# Patient Record
Sex: Male | Born: 1990 | Race: White | Hispanic: No | Marital: Single | Smoking: Former smoker
Health system: Southern US, Community
[De-identification: ages and names within clinical notes are randomized; demographics above are authoritative.]

---
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
## 2013-04-01 ENCOUNTER — Ambulatory Visit: Payer: BC Managed Care – PPO | Admitting: Internal Medicine

## 2013-04-01 VITALS — BP 118/74 | HR 86 | Temp 98.2°F | Resp 16 | Ht 70.0 in | Wt 162.0 lb

## 2013-04-01 DIAGNOSIS — J029 Acute pharyngitis, unspecified: Secondary | ICD-10-CM

## 2013-04-01 DIAGNOSIS — R059 Cough, unspecified: Secondary | ICD-10-CM

## 2013-04-01 DIAGNOSIS — R05 Cough: Secondary | ICD-10-CM

## 2013-04-01 DIAGNOSIS — R509 Fever, unspecified: Secondary | ICD-10-CM

## 2013-04-01 LAB — POCT RAPID STREP A (OFFICE): Rapid Strep A Screen: NEGATIVE

## 2013-04-01 MED ORDER — AZITHROMYCIN 500 MG PO TABS: 500.0000 mg | ORAL_TABLET | Freq: Every day | ORAL | Status: DC

## 2013-04-01 NOTE — Progress Notes (Signed)
  Subjective:    Patient ID: Dustin Fisher, male    DOB: [birth year], 22 y.o.   MRN: [id number]  HPI Pt here c/o of sore throat, cough (green, yellow), swelling of the eyes. Started 1 day ago. Denies fever, nausea or sinus pressure. Pt is taking tylenol multi symptoms with no relieve.    Review of Systems     Objective:   Physical Exam        Assessment & Plan:

## 2013-04-01 NOTE — Patient Instructions (Addendum)
Viral and Bacterial Pharyngitis Pharyngitis is soreness (inflammation) or infection of the pharynx. It is also called a sore throat. CAUSES  Most sore throats are caused by viruses and are part of a cold. However, some sore throats are caused by strep and other bacteria. Sore throats can also be caused by post nasal drip from draining sinuses, allergies and sometimes from sleeping with an open mouth. Infectious sore throats can be spread from person to person by coughing, sneezing and sharing cups or eating utensils. TREATMENT  Sore throats that are viral usually last 3-4 days. Viral illness will get better without medications (antibiotics). Strep throat and other bacterial infections will usually begin to get better about 24-48 hours after you begin to take antibiotics. HOME CARE INSTRUCTIONS   If the caregiver feels there is a bacterial infection or if there is a positive strep test, they will prescribe an antibiotic. The full course of antibiotics must be taken. If the full course of antibiotic is not taken, you or your child may become ill again. If you or your child has strep throat and do not finish all of the medication, serious heart or kidney diseases may develop.  Drink enough water and fluids to keep your urine clear or pale yellow.  Only take over-the-counter or prescription medicines for pain, discomfort or fever as directed by your caregiver.  Get lots of rest.  Gargle with salt water ( tsp. of salt in a glass of water) as often as every 1-2 hours as you need for comfort.  Hard candies may soothe the throat if individual is not at risk for choking. Throat sprays or lozenges may also be used. SEEK MEDICAL CARE IF:   Large, tender lumps in the neck develop.  A rash develops.  Green, yellow-brown or bloody sputum is coughed up.  Your baby is older than 3 months with a rectal temperature of 100.5 F (38.1 C) or higher for more than 1 day. SEEK IMMEDIATE MEDICAL CARE IF:   A  stiff neck develops.  You or your child are drooling or unable to swallow liquids.  You or your child are vomiting, unable to keep medications or liquids down.  You or your child has severe pain, unrelieved with recommended medications.  You or your child are having difficulty breathing (not due to stuffy nose).  You or your child are unable to fully open your mouth.  You or your child develop redness, swelling, or severe pain anywhere on the neck.  You have a fever.  Your baby is older than 3 months with a rectal temperature of 102 F (38.9 C) or higher.  Your baby is 78 months old or younger with a rectal temperature of 100.4 F (38 C) or higher. MAKE SURE YOU:   Understand these instructions.  Will watch your condition.  Will get help right away if you are not doing well or get worse. Document Released: 10/11/2005 Document Revised: 01/03/2012 Document Reviewed: 01/08/2008 [facility] Patient Information 2014 [place], [place]. Smoking Cessation Quitting smoking is important to your health and has many advantages. However, it is not always easy to quit since nicotine is a very addictive drug. Often times, people try 3 times or more before being able to quit. This document explains the best ways for you to prepare to quit smoking. Quitting takes hard work and a lot of effort, but you can do it. ADVANTAGES OF QUITTING SMOKING  You will live longer, feel better, and live better.  Your body will  feel the impact of quitting smoking almost immediately.  Within 20 minutes, blood pressure decreases. Your pulse returns to its normal level.  After 8 hours, carbon monoxide levels in the blood return to normal. Your oxygen level increases.  After 24 hours, the chance of having a heart attack starts to decrease. Your breath, hair, and body stop smelling like smoke.  After 48 hours, damaged nerve endings begin to recover. Your sense of taste and smell improve.  After 72 hours, the body is  virtually free of nicotine. Your bronchial tubes relax and breathing becomes easier.  After 2 to 12 weeks, lungs can hold more air. Exercise becomes easier and circulation improves.  The risk of having a heart attack, stroke, cancer, or lung disease is greatly reduced.  After 1 year, the risk of coronary heart disease is cut in half.  After 5 years, the risk of stroke falls to the same as a nonsmoker.  After 10 years, the risk of lung cancer is cut in half and the risk of other cancers decreases significantly.  After 15 years, the risk of coronary heart disease drops, usually to the level of a nonsmoker.  If you are pregnant, quitting smoking will improve your chances of having a healthy baby.  The people you live with, especially any children, will be healthier.  You will have extra money to spend on things other than cigarettes. QUESTIONS TO THINK ABOUT BEFORE ATTEMPTING TO QUIT You may want to talk about your answers with your caregiver.  Why do you want to quit?  If you tried to quit in the past, what helped and what did not?  What will be the most difficult situations for you after you quit? How will you plan to handle them?  Who can help you through the tough times? Your family? Friends? A caregiver?  What pleasures do you get from smoking? What ways can you still get pleasure if you quit? Here are some questions to ask your caregiver:  How can you help me to be successful at quitting?  What medicine do you think would be best for me and how should I take it?  What should I do if I need more help?  What is smoking withdrawal like? How can I get information on withdrawal? GET READY  Set a quit date.  Change your environment by getting rid of all cigarettes, ashtrays, matches, and lighters in your home, car, or work. Do not let people smoke in your home.  Review your past attempts to quit. Think about what worked and what did not. GET SUPPORT AND ENCOURAGEMENT You  have a better chance of being successful if you have help. You can get support in many ways.  Tell your family, friends, and co-workers that you are going to quit and need their support. Ask them not to smoke around you.  Get individual, group, or telephone counseling and support. Programs are available at [employer] and health centers. Call your local health department for information about programs in your area.  Spiritual beliefs and practices may help some smokers quit.  Download a "quit meter" on your computer to keep track of quit statistics, such as how long you have gone without smoking, cigarettes not smoked, and money saved.  Get a self-help book about quitting smoking and staying off of tobacco. LEARN NEW SKILLS AND BEHAVIORS  Distract yourself from urges to smoke. Talk to someone, go for a walk, or occupy your time with a task.  Change your normal routine. Take a different route to work. Drink tea instead of coffee. Eat breakfast in a different place.  Reduce your stress. Take a hot bath, exercise, or read a book.  Plan something enjoyable to do every day. Reward yourself for not smoking.  Explore interactive web-based programs that specialize in helping you quit. GET MEDICINE AND USE IT CORRECTLY Medicines can help you stop smoking and decrease the urge to smoke. Combining medicine with the above behavioral methods and support can greatly increase your chances of successfully quitting smoking.  Nicotine replacement therapy helps deliver nicotine to your body without the negative effects and risks of smoking. Nicotine replacement therapy includes nicotine gum, lozenges, inhalers, nasal sprays, and skin patches. Some may be available over-the-counter and others require a prescription.  Antidepressant medicine helps people abstain from smoking, but how this works is unknown. This medicine is available by prescription.  Nicotinic receptor partial agonist medicine simulates  the effect of nicotine in your brain. This medicine is available by prescription. Ask your caregiver for advice about which medicines to use and how to use them based on your health history. Your caregiver will tell you what side effects to look out for if you choose to be on a medicine or therapy. Carefully read the information on the package. Do not use any other product containing nicotine while using a nicotine replacement product.  RELAPSE OR DIFFICULT SITUATIONS Most relapses occur within the first 3 months after quitting. Do not be discouraged if you start smoking again. Remember, most people try several times before finally quitting. You may have symptoms of withdrawal because your body is used to nicotine. You may crave cigarettes, be irritable, feel very hungry, cough often, get headaches, or have difficulty concentrating. The withdrawal symptoms are only temporary. They are strongest when you first quit, but they will go away within 10 14 days. To reduce the chances of relapse, try to:  Avoid drinking alcohol. Drinking lowers your chances of successfully quitting.  Reduce the amount of caffeine you consume. Once you quit smoking, the amount of caffeine in your body increases and can give you symptoms, such as a rapid heartbeat, sweating, and anxiety.  Avoid smokers because they can make you want to smoke.  Do not let weight gain distract you. Many smokers will gain weight when they quit, usually less than 10 pounds. Eat a healthy diet and stay active. You can always lose the weight gained after you quit.  Find ways to improve your mood other than smoking. FOR MORE INFORMATION  www.smokefree.gov  Document Released: 10/05/2001 Document Revised: 04/11/2012 Document Reviewed: 01/20/2012 [facility] Patient Information 2014 [place], [place].

## 2013-04-01 NOTE — Progress Notes (Signed)
  Subjective:    Patient ID: Dustin Fisher, male    DOB: 1990-11-05, 22 y.o.   MRN: [id number]  HPI Young smoker with very sore throat, has minor cough with yellow sputum.   Review of Systems smoker    Objective:   Physical Exam Very red throat Lungs few rhonchi  RST  Results for orders placed in visit on 04/01/13  POCT RAPID STREP A (OFFICE)      Result Value Range   Rapid Strep A Screen Negative  Negative       Assessment & Plan:  Zithromax 500mg  Quit smoking/Nicorrete

## 2013-08-06 ENCOUNTER — Ambulatory Visit (INDEPENDENT_AMBULATORY_CARE_PROVIDER_SITE_OTHER): Payer: BC Managed Care – PPO | Admitting: Family Medicine

## 2013-08-06 VITALS — BP 115/60 | HR 90 | Temp 98.7°F | Resp 18 | Ht 69.0 in | Wt 156.0 lb

## 2013-08-06 DIAGNOSIS — J029 Acute pharyngitis, unspecified: Secondary | ICD-10-CM

## 2013-08-06 DIAGNOSIS — R509 Fever, unspecified: Secondary | ICD-10-CM

## 2013-08-06 MED ORDER — PENICILLIN V POTASSIUM 500 MG PO TABS: 500.0000 mg | ORAL_TABLET | Freq: Two times a day (BID) | ORAL | Status: DC

## 2013-08-06 MED ORDER — IBUPROFEN 200 MG PO TABS: 600.0000 mg | ORAL_TABLET | Freq: Once | ORAL | Status: AC

## 2013-08-06 NOTE — Patient Instructions (Signed)
I will be in touch with you regarding your throat culture.  Use the penicillin as directed, and use ibuprofen or tylenol as needed for fever.  Rest and drink plenty of fluids.  Let me know if you are not better in the next couple of days- Sooner if worse.

## 2013-08-06 NOTE — Progress Notes (Addendum)
[facility] [address] [place] [address] [phone]  Date:  08/06/2013   Name:  Dustin Fisher   DOB:  1991/08/22   MRN:  [id number]  PCP:  No PCP Per Patient    Chief Complaint: Sore Throat   History of Present Illness:  Raequon Catanzaro is a 22 y.o. very pleasant male patient who presents with the following:  He awoke yesterday with a ST and pain into the right cervical glands/ right ear/  No cough, but it hurts to swallow and he does note some mucus drainage.    He has noted body aches and chills.  He not noted noted a fever until last night- he did not sleep well. He took some dayquil yesterday- he did take some delsym this morning No Gi symptoms.   No nasal sx.    He is generally healthy.  There have been sick contacts at this job.    There are no active problems to display for this patient.   History reviewed. No pertinent past medical history.  Past Surgical History  Procedure Laterality Date  . Percutaneous pinning elbow fracture  2008    History  Substance Use Topics  . Smoking status: Current Every Day Smoker -- 0.20 packs/day    Types: Cigarettes  . Smokeless tobacco: Not on file  . Alcohol Use: 1.2 oz/week    2 Cans of beer per week    Family History  Problem Relation Age of Onset  . Hypertension Mother   . Diabetes Mother   . Diabetes Maternal Grandmother   . Heart disease Paternal Grandfather     Allergies  Allergen Reactions  . Morphine And Related Hives    Medication list has been reviewed and updated.  Current Outpatient Prescriptions on File Prior to Visit  Medication Sig Dispense Refill  . azithromycin (ZITHROMAX) 500 MG tablet Take 1 tablet (500 mg total) by mouth daily.  5 tablet  0   No current facility-administered medications on file prior to visit.    Review of Systems:  As per HPI- otherwise negative.   Physical Examination: Filed Vitals:   08/06/13 0823  BP: 115/60  Pulse: 103  Temp: 101 F  (38.3 C)  Resp: 18   Filed Vitals:   08/06/13 0823  Height: 5\' 9"  (1.753 m)  Weight: 156 lb (70.761 kg)   Body mass index is 23.03 kg/(m^2). Ideal Body Weight: Weight in (lb) to have BMI = 25: 168.9  GEN: WDWN, NAD, Non-toxic, A & O x 3 HEENT: Atraumatic, Normocephalic. Neck supple. No masses, No LAD.  Bilateral TM wnl, oropharynx injected, blood on swab.  PEERL,EOMI.   Ears and Nose: No external deformity. CV: RRR, No M/G/R. No JVD. No thrill. No extra heart sounds. PULM: CTA B, no wheezes, crackles, rhonchi. No retractions. No resp. distress. No accessory muscle use. ABD: S, NT, ND. No rebound. No HSM. EXTR: No c/c/e NEURO Normal gait.  PSYCH: Normally interactive. Conversant. Not depressed or anxious appearing.  Calm demeanor.   Given 60 mg of ibuprofen; fever and tachycardia resolved  Results for orders placed in visit on 08/06/13  POCT RAPID STREP A (OFFICE)      Result Value Range   Rapid Strep A Screen Negative  Negative    Assessment and Plan: Pharyngitis - Plan: POCT rapid strep A, Culture, Group A Strep, penicillin v potassium (VEETID) 500 MG tablet  Fever, unspecified - Plan: ibuprofen (ADVIL,MOTRIN) tablet 600 mg, Culture, Group A  Strep  Pharyngitis and fever.  Suspect strep although rapid negative.  Start abx, supportive care.  Will plan further follow- up pending labs. See patient instructions for more details.     Signed Abbe Amsterdam, MD  10/15: called but no answer and VM not set up.  Letter to pt

## 2013-08-08 ENCOUNTER — Encounter: Payer: Self-pay | Admitting: Family Medicine

## 2013-08-08 LAB — CULTURE, GROUP A STREP: Organism ID, Bacteria: NORMAL

## 2015-07-21 ENCOUNTER — Ambulatory Visit (INDEPENDENT_AMBULATORY_CARE_PROVIDER_SITE_OTHER): Payer: BLUE CROSS/BLUE SHIELD

## 2015-07-21 ENCOUNTER — Ambulatory Visit (INDEPENDENT_AMBULATORY_CARE_PROVIDER_SITE_OTHER): Payer: BLUE CROSS/BLUE SHIELD | Admitting: Internal Medicine

## 2015-07-21 VITALS — BP 120/82 | HR 52 | Temp 97.9°F | Resp 16 | Ht 69.75 in | Wt 162.0 lb

## 2015-07-21 DIAGNOSIS — M546 Pain in thoracic spine: Secondary | ICD-10-CM

## 2015-07-21 DIAGNOSIS — S29019A Strain of muscle and tendon of unspecified wall of thorax, initial encounter: Secondary | ICD-10-CM

## 2015-07-21 DIAGNOSIS — S29009A Unspecified injury of muscle and tendon of unspecified wall of thorax, initial encounter: Secondary | ICD-10-CM | POA: Diagnosis not present

## 2015-07-21 MED ORDER — IBUPROFEN 600 MG PO TABS: 600.0000 mg | ORAL_TABLET | Freq: Three times a day (TID) | ORAL | Status: DC | PRN

## 2015-07-21 MED ORDER — METHOCARBAMOL 750 MG PO TABS: 750.0000 mg | ORAL_TABLET | Freq: Four times a day (QID) | ORAL | Status: DC

## 2015-07-21 NOTE — Patient Instructions (Signed)
Thoracic Strain Thoracic strain is an injury to the muscles of the upper back. A mild strain may take only 1 week to heal. Torn muscles or tendons may take 6 weeks to 2 months to heal. HOME CARE  Put ice on the injured area.  Put ice in a plastic bag.  Place a towel between your skin and the bag.  Leave the ice on for 15-20 minutes, 03-04 times a day, for the first 2 days.  Only take medicine as told by your doctor.  Go to physical therapy and perform exercises as told by your doctor.  Use wraps and back braces as told by your doctor.  Warm up before being active. GET HELP RIGHT AWAY IF:   There is more bruising, puffiness (swelling), or pain.  Medicine does not help the pain.  You have trouble breathing, chest pain, or a fever.  Your problems seem to be getting worse, not better. MAKE SURE YOU:   Understand these instructions.  Will watch your condition.  Will get help right away if you are not doing well or get worse. Document Released: 03/29/2008 Document Revised: 01/03/2012 Document Reviewed: 11/30/2010 [facility] Patient Information 2015 [place], [place]. This information is not intended to replace advice given to you by your health care provider. Make sure you discuss any questions you have with your health care provider. Subscapularis Disruption with Rehab A subscapularis disruption is a partial or complete tear of the subscapularis muscle or tendon. The subscapularis muscle is one of the rotator cuff muscles and is responsible for stabilizing the shoulder joint (glenohumeral) and inwardly (medially) rotating the upper arm. Subscapularis disruptions are uncommon, because the subscapularis is the strongest of the rotator cuff muscles. Collectively, the rotator cuff muscles are responsible for stabilizing the shoulder joint as well as providing the force for much of the shoulder movements. The shoulder joint allows more movement that any other joint, and for this reason it is  susceptible to injury. SYMPTOMS   Shoulder pain, especially in the front (anterior) part of the shoulder and upper arm.  Pain that worsens with function of the subscapularis.  Tenderness, inflammation, or redness over the injury.  Decreased shoulder function, especially medial rotation.  Crackling sound (crepitation) when the shoulder is moved.  Recurrent shoulder dislocation, if disruption is due to or associated with shoulder dislocation. CAUSES  Subscapularis disruptions occur when a force is placed on the subscapularis muscle or tendon that is greater than it can withstand. Common mechanisms of injury include:  Forceful outward (external) rotation of the shoulder.  Hyperextension of the shoulder.  Direct trauma to the shoulder.  Shoulder dislocation. RISK INCREASES WITH:  Contact sports (football, wrestling, or rugby).  Previous shoulder injury.  Poor strength and flexibility.  Failure to warm-up properly before activity.  Poorly fitted or padded protective equipment.  Shoulder dislocation or subluxation.  Shoulder surgery that required moving the subscapularis muscle or tendon. PREVENTION  Warm up and stretch properly before activity.  Maintain physical fitness:  Strength, flexibility, and endurance.  Cardiovascular fitness.  Learn and use proper technique. When possible, have a coach correct improper technique. PROGNOSIS  Subscapularis disruptions are unable to heal without surgical intervention. RELATED COMPLICATIONS   Recurrent symptoms that result in a chronic problem.  Other shoulder conditions (frozen shoulder).  Inability to compete in athletics at the same level of play.  Recurrent shoulder dislocation.  Risks of surgery: infection, bleeding, nerve damage, or damage to surrounding tissues. TREATMENT  Treatment initially involves the use  of ice and medication to help reduce pain and inflammation. The use of strengthening and stretching  exercises may help reduce pain with activity. These exercises may be performed at home or with referral to a therapist. Surgery is the definitive treatment for this injury. Surgery involves reattaching the torn tendon. In order to have the best outcome, then surgery should be performed within 3 months of injury. After surgery a post-operative rehabilitation program is necessary to regain strength and a full range of motion. Return to sports typically takes 6 to 12 months. MEDICATION   If pain medication is necessary, then nonsteroidal anti-inflammatory medications, such as aspirin and ibuprofen, or other minor pain relievers, such as acetaminophen, are often recommended.  Do not take pain medication for 7 days before surgery.  Prescription pain relievers may be given if deemed necessary by your caregiver. Use only as directed and only as much as you need. HEAT AND COLD  Cold treatment (icing) relieves pain and reduces inflammation. Cold treatment should be applied for 10 to 15 minutes every 2 to 3 hours for inflammation and pain and immediately after any activity that aggravates your symptoms. Use ice packs or massage the area with a piece of ice (ice massage).  Heat treatment may be used prior to performing the stretching and strengthening activities prescribed by your caregiver, physical therapist, or athletic trainer. Use a heat pack or soak your injury in warm water. SEEK MEDICAL CARE IF:  Treatment seems to offer no benefit, or the condition worsens.  Any medications produce adverse side effects.  Any complications from surgery occur:  Pain, numbness, or coldness in the extremity operated upon.  Discoloration of the nail beds (they become blue or gray) of the extremity operated upon.  Signs of infections (fever, pain, inflammation, redness, or persistent bleeding). EXERCISES RANGE OF MOTION (ROM) AND STRETCHING EXERCISES ROM - Subscapularis Disruption These exercises may help you  restore your elbow mobility once your physician has discontinued your immobilization period. Beginning these before your provider's approval may result in delayed healing. Your symptoms may resolve with or without further involvement from your physician, physical therapist or athletic trainer. While completing these exercises, remember:   Restoring tissue flexibility helps normal motion to return to the joints. This allows healthier, less painful movement and activity.  An effective stretch should be held for at least 30 seconds. A stretch should never be painful. You should only feel a gentle lengthening or release in the stretch. ROM - Pendulum   Bend at the waist so that your right / left arm falls away from your body. Support yourself with your opposite hand on a solid surface, such as a table or a countertop.  Your right / left arm should be perpendicular to the ground. If it is not perpendicular, you need to lean over farther. Relax the muscles in your right / left arm and shoulder as much as possible.  Gently sway your hips and trunk so they move your right / left arm without any use of your right / left shoulder muscles.  Progress your movements so that your right / left arm moves side to side, then forward and backward, and finally, both clockwise and counterclockwise.  Complete __________ repetitions in each direction. Many people use this exercise to relieve discomfort in their shoulder as well as to gain range of motion. Repeat __________ times. Complete this exercise __________ times per day. STRETCH - Flexion, Seated   Sit in a firm chair so that your  right / left forearm can rest on a table or on a table or countertop. Your right / left elbow should rest below the height of your shoulder so that your shoulder feels supported and not tense or uncomfortable.  Keeping your right / left shoulder relaxed, lean forward at your waist, allowing your right / left hand to slide forward.  Bend forward until you feel a moderate stretch in your shoulder, but before you feel an increase in your pain.  Hold __________ seconds. Slowly return to your starting position. Repeat __________ times. Complete this exercise __________ times per day.  STRETCH - Flexion, Standing  Stand with good posture. With an underhand grip on your right / left and an overhand grip on the opposite hand, grasp a broomstick or cane so that your hands are a little more than shoulder-width apart.  Keeping your right / leftelbow straight and shoulder muscles relaxed, push the stick with your opposite hand to raise your right / left arm in front of your body and then overhead. Raise your arm until you feel a stretch in your right / left shoulder, but before you have increased shoulder pain.  Try to avoid shrugging your right / left shoulder as your arm rises by keeping your shoulder blade tucked down and toward your mid-back spine. Hold __________ seconds.  Slowly return to the starting position. Repeat __________ times. Complete this exercise __________ times per day. STRETCH - Abduction, Supine  Stand with good posture. With an underhand grip on your right / left and an overhand grip on the opposite hand, grasp a broomstick or cane so that your hands are a little more than shoulder-width apart.  Keeping your right / leftelbow straight and shoulder muscles relaxed, push the stick with your opposite hand to raise your right / left arm out to the side of your body and then overhead. Raise your arm until you feel a stretch in your right / left shoulder, but before you have increased shoulder pain.  Try to avoid shrugging your right / left shoulder as your arm rises by keeping your shoulder blade tucked down and toward your mid-back spine. Hold __________ seconds.  Slowly return to the starting position. Repeat __________ times. Complete this exercise __________ times per day. ROM - Flexion, Active-Assisted  Lie  on your back. You may bend your knees for comfort.  Grasp a broomstick or cane so your hands are about shoulder-width apart. Your right / left hand should grip the end of the stick/cane so that your hand is positioned "thumbs-up," as if you were about to shake hands.  Using your healthy arm to lead, raise your right / left arm overhead until you feel a gentle stretch in your shoulder. Hold __________ seconds.  Use the stick/cane to assist in returning your right / left arm to its starting position. Repeat __________ times. Complete this exercise __________ times per day.  STRETCH - External Rotation and Abduction  Stagger your stance through a doorframe. It does not matter which foot is forward.  Choose one of the following positions as instructed by your physician, physical therapist or athletic trainer. Place your hands:  and forearms above your head and on the door frame.  and forearms at head-height and on the door frame.  at elbow-height and on the door frame.  Keeping your head and chest upright and your stomach muscles tight to prevent over-extending your low-back, slowly shift your weight onto your front foot until you feel a stretch  across your chest and/or in the front of your shoulders.  Hold __________ seconds. Shift your weight to your back foot to release the stretch. Repeat __________ times. Complete this stretch __________ times per day.  STRENGTHENING EXERCISES - Subscapularis Disruption These exercises may help you when beginning to rehabilitate your injury. They may resolve your symptoms with or without further involvement from your physician, physical therapist or athletic trainer. While completing these exercises, remember:   Muscles can gain both the endurance and the strength needed for everyday activities through controlled exercises.  Complete these exercises as instructed by your physician, physical therapist or athletic trainer. Progress with the resistance and  repetition exercises only as your caregiver advises.  You may experience muscle soreness or fatigue, but the pain or discomfort you are trying to eliminate should never worsen during these exercises. If this pain does worsen, stop and make certain you are following the directions exactly. If the pain is still present after adjustments, discontinue the exercise until you can discuss the trouble with your clinician. STRENGTH - Shoulder Extensors, Prone  Lie on your stomach on a firm surface so that your right / left arm overhangs the edge. Rest your forehead on your opposite forearm. With your thumb facing away from your body and your elbow straight, hold a __________ weight in your hand.  Squeeze your right / left shoulder blade to your mid-back spine and then slowly raise your arm behind you to the height of the bed.  Hold for __________ seconds. Slowly reverse the directions and return to the starting position, controlling the weight as you lower your arm. Repeat __________ times. Complete this exercise __________ times per day.  STRENGTH - Internal Rotators, Isometric  Keep your right / left elbow at your side and bend it 90 degrees.  Step into a door frame so that the inside of your right / left wrist can press against the door frame without your upper arm leaving your side.  Gently press your right / left wrist into the door frame as if you were trying to draw the palm of your hand to your abdomen. Gradually increase the tension until you are pressing as hard as you can without shrugging your shoulder or increasing any shoulder discomfort.  Hold __________ seconds.  Release the tension slowly. Relax your shoulder muscles completely before you do the next repetition. Repeat __________ times. Complete this exercise __________ times per day.  STRENGTH - Internal Rotators  Secure a rubber exercise band/tubing to a fixed object so that it is at the same height as your right / left elbow when  you are standing or sitting on a firm surface.  Stand or sit so that the secured exercise band/tubing is at your right / left side.  Bend your elbow 90 degrees. Place a folded towel or small pillow under your right / left arm so that your elbow is a few inches away from your side.  Keeping the tension on the exercise band/tubing, pull it across your body toward your abdomen. Be sure to keep your body steady so that the movement is only coming from your shoulder rotating.  Hold __________ seconds. Release the tension in a controlled manner as you return to the starting position. Repeat __________ times. Complete this exercise __________ times per day.  STRENGTH - External Rotators   Secure a rubber exercise band/tubing to a fixed object so that it is at the same height as your right / left elbow when you are  standing or sitting on a firm surface.  Stand or sit so that the secured exercise band/tubing is at your side that is not injured.  Bend your elbow 90 degrees. Place a folded towel or small pillow under your right / left arm so that your elbow is a few inches away from your side.  Keeping the tension on the exercise band/tubing, pull it away from your body, as if pivoting on your elbow. Be sure to keep your body steady so that the movement is only coming from your shoulder rotating.  Hold __________ seconds. Release the tension in a controlled manner as you return to the starting position. Repeat __________ times. Complete this exercise __________ times per day.  STRENGTH - Shoulder Extensors   Secure a rubber exercise band/tubing so that it is at the height of your shoulders when you are either standing or sitting on a firm arm-less chair.  With a thumbs-up grip, grasp an end of the band/tubing in each hand. Straighten your elbows and lift your hands straight in front of you at shoulder height. Step back away from the secured end of band/tubing until it becomes tense.  Squeezing your  shoulder blades together, pull your hands down to the sides of your thighs. Do not allow your hands to go behind you.  Hold for __________ seconds. Slowly ease the tension on the band/tubing as you reverse the directions and return to the starting position. Repeat __________ times. Complete this exercise __________ times per day.  STRENGTH - Scapular Protractors, Quadruped  Get onto your hands and knees with your shoulders directly over your hands (or as close as you comfortably can be).  Keeping your elbows locked, lift the back of your rib cage up into your shoulder blades so your mid-back rounds-out. Keep your neck muscles relaxed.  Hold this position for __________ seconds. Slowly return to the starting position and allow your muscles to relax completely before completing the next repetition. Repeat __________ times. Complete this exercise __________ times per day.  STRENGTH - Scapular Depressors  Find a sturdy chair without wheels.  Keeping your feet on the floor, lift your bottom from the seat and lock your elbows.  Keeping your elbows straight, allow gravity to pull your body weight down. Your shoulders will rise toward your ears.  Raise your body against gravity by drawing your shoulder blades down your back, shortening the distance between your shoulders and ears. Although your feet should always maintain contact with the floor, your feet should progressively support less body weight as you get stronger.  Hold __________ seconds. In a controlled and slow manner, lower your body weight to begin the next repetition. Repeat __________ times. Complete this exercise __________ times per day.  STRENGTH - Horizontal Adductors  Secure a rubber exercise band/tubing so that it is at the height of your shoulders when you are either standing or sitting on a firm arm-less chair.  Turn away from the secured band/tube so it is directly behind you. Grasp an end of the band/tubing in each hand  and have your palms face each other. Step forward until the end of band/tubing until it becomes tense.  Keeping your arms at your sides, lift your elbows so they are 90 degrees from your body. Your arms should be slightly bent.  Keeping your arms elevated 90 degrees, draw your palms together  Hold __________ seconds. Slowly ease the tension on the band/tubing as you reverse the directions and return to the starting position. Repeat  __________ times. Complete this exercise __________ times per day. Document Released: 10/11/2005 Document Revised: 01/03/2012 Document Reviewed: 01/23/2009 [facility] Patient Information 2015 [place], [place]. This information is not intended to replace advice given to you by your health care provider. Make sure you discuss any questions you have with your health care provider.

## 2015-07-21 NOTE — Progress Notes (Signed)
Patient ID: Dustin Fisher, male   DOB: [birth year], 24 y.o.   MRN: [id number]   07/21/2015 at 11:56 AM  Dustin Fisher / DOB: 1991-01-17 / MRN: [id number]  Problem list reviewed and updated by me where necessary.   SUBJECTIVE  Dustin Fisher is a 24 y.o. well appearing male presenting for the chief complaint of thoracic right chest pain, starts in back and can radiate right neck and anterior sternally. No weakness, numbness, function loss. It started two weeks ago, had im[proved but swung the driver and felt pain right sub scapular. Also felt a pop reaching at work same area. No c/o NMSV loss..     He  has no past medical history on file.    Medications reviewed and updated by myself where necessary, and exist elsewhere in the encounter.   Dustin Fisher is allergic to morphine and related. He  reports that he quit smoking about 2 months ago. His smoking use included Cigarettes. He smoked 0.20 packs per day. He does not have any smokeless tobacco history on file. He reports that he drinks about 1.2 oz of alcohol per week. He reports that he does not use illicit drugs. He  has no sexual activity history on file. The patient  has past surgical history that includes Percutaneous pinning elbow fracture (2008).  His family history includes Diabetes in his maternal grandmother and mother; Heart disease in his paternal grandfather; Hypertension in his mother.  Review of Systems  Constitutional: Negative for fever.  Respiratory: Negative for shortness of breath.   Cardiovascular: Negative for chest pain.  Gastrointestinal: Negative for nausea.  Musculoskeletal: Positive for back pain and neck pain.  Skin: Negative for rash.  Neurological: Negative for dizziness, tingling, tremors, sensory change, focal weakness and headaches.    OBJECTIVE  His  height is 5' 9.75" (1.772 m) and weight is 162 lb (73.483 kg). His oral temperature is 97.9 F (36.6 C). His blood pressure is 120/82 and his pulse is 52. His  respiration is 16 and oxygen saturation is 98%.  The patient's body mass index is 23.4 kg/(m^2).  Physical Exam  Vitals reviewed. Constitutional: He is oriented to person, place, and time. He appears well-developed and well-nourished. No distress.  HENT:  Head: Normocephalic.  Nose: Nose normal.  Eyes: Conjunctivae and EOM are normal.  Neck: Normal range of motion.  Respiratory: Effort normal and breath sounds normal. No respiratory distress. He has no wheezes. He has no rales. He exhibits tenderness.  Musculoskeletal: He exhibits tenderness.       Thoracic back: He exhibits decreased range of motion, tenderness, swelling, edema, pain and spasm. He exhibits no bony tenderness and normal pulse.       Back:       Arms: Neurological: He is alert and oriented to person, place, and time. He has normal strength. No cranial nerve deficit or sensory deficit. He exhibits normal muscle tone. He displays a negative Romberg sign. Coordination normal.  Psychiatric: He has a normal mood and affect.   [facility] reading (PRIMARY) by  Dr. Rodrigo Ran cxr and thoracic spine    No results found for this or any previous visit (from the past 24 hour(s)).  ASSESSMENT & PLAN  Dustin Fisher was seen today for back pain.  Diagnoses and all orders for this visit:  Right-sided thoracic back pain -     DG Chest 2 View; Future -     DG Thoracic Spine 2 View; Future -     ibuprofen (ADVIL,MOTRIN) 600  MG tablet; Take 1 tablet (600 mg total) by mouth every 8 (eight) hours as needed. -     methocarbamol (ROBAXIN-750) 750 MG tablet; Take 1 tablet (750 mg total) by mouth 4 (four) times daily.  Acute thoracic myofascial strain, initial encounter -     ibuprofen (ADVIL,MOTRIN) 600 MG tablet; Take 1 tablet (600 mg total) by mouth every 8 (eight) hours as needed. -     methocarbamol (ROBAXIN-750) 750 MG tablet; Take 1 tablet (750 mg total) by mouth 4 (four) times daily.

## 2016-01-10 ENCOUNTER — Ambulatory Visit (INDEPENDENT_AMBULATORY_CARE_PROVIDER_SITE_OTHER): Payer: Managed Care, Other (non HMO) | Admitting: Family Medicine

## 2016-01-10 VITALS — BP 152/80 | HR 77 | Temp 98.6°F | Resp 12 | Ht 71.0 in | Wt 171.0 lb

## 2016-01-10 DIAGNOSIS — Z202 Contact with and (suspected) exposure to infections with a predominantly sexual mode of transmission: Secondary | ICD-10-CM | POA: Diagnosis not present

## 2016-01-10 DIAGNOSIS — R350 Frequency of micturition: Secondary | ICD-10-CM

## 2016-01-10 LAB — HIV ANTIBODY (ROUTINE TESTING W REFLEX): HIV 1&2 Ab, 4th Generation: NONREACTIVE

## 2016-01-10 LAB — POCT URINALYSIS DIP (MANUAL ENTRY)
BILIRUBIN UA: NEGATIVE
BILIRUBIN UA: NEGATIVE
Blood, UA: NEGATIVE
GLUCOSE UA: NEGATIVE
LEUKOCYTES UA: NEGATIVE
Nitrite, UA: NEGATIVE
Protein Ur, POC: NEGATIVE
Spec Grav, UA: 1.015
Urobilinogen, UA: 0.2
pH, UA: 8

## 2016-01-10 LAB — POC MICROSCOPIC URINALYSIS (UMFC): Mucus: ABSENT

## 2016-01-10 MED ORDER — AZITHROMYCIN 250 MG PO TABS
ORAL_TABLET | ORAL | Status: DC
Start: 2016-01-10 — End: 2018-01-13

## 2016-01-10 NOTE — Progress Notes (Signed)
Patient ID: Dustin Fisher, male    DOB: Nov 22, 1990  Age: 25 y.o. MRN: [id number]  Chief Complaint  Patient presents with  . Exposure to STD    chlamydia     Subjective:   Patient's girlfriend has chlamydia. He is only been without one other person in the last year. They don't know who gave it to you. He has had some urinary frequency. No other symptoms. No prior history of STDs.  Current allergies, medications, problem list, past/family and social histories reviewed.  Objective:  BP 152/80 mmHg  Pulse 77  Temp(Src) 98.6 F (37 C) (Oral)  Resp 12  Ht 5\' 11"  (1.803 m)  Wt 171 lb (77.565 kg)  BMI 23.86 kg/m2  SpO2 98%  No acute distress. Normal male external genitalia. Testes descended. No hernias.Testicular or penile lesions.  Assessment & Plan:   Assessment: 1. STD exposure   2. Exposure to chlamydia   3. Urine frequency       Plan: We'll treat while cultures are pending. See instructions.  Orders Placed This Encounter  Procedures  . GC/Chlamydia Probe Amp    Order Specific Question:  Source    Answer:  uring  . RPR  . HIV antibody  . POCT Microscopic Urinalysis ([facility])  . POCT urinalysis dipstick    Meds ordered this encounter  Medications  . azithromycin (ZITHROMAX) 250 MG tablet    Sig: Take 4 pills single dose now for probable chlamydia    Dispense:  4 tablet    Refill:  0    Results for orders placed or performed in visit on 01/10/16  POCT Microscopic Urinalysis ([facility])  Result Value Ref Range   WBC,UR,HPF,POC None None WBC/hpf   RBC,UR,HPF,POC None None RBC/hpf   Bacteria None None, Too numerous to count   Mucus Absent Absent   Epithelial Cells, UR Per Microscopy Few (A) None, Too numerous to count cells/hpf  POCT urinalysis dipstick  Result Value Ref Range   Color, UA yellow yellow   Clarity, UA clear clear   Glucose, UA negative negative   Bilirubin, UA negative negative   Ketones, POC UA negative negative   Spec Grav, UA 1.015    Blood, UA  negative negative   pH, UA 8.0    Protein Ur, POC negative negative   Urobilinogen, UA 0.2    Nitrite, UA Negative Negative   Leukocytes, UA Negative Negative        Patient Instructions   Always use condoms if you choose to be sexually involved  Take azithromycin 1000 mg single dose (250 mg 4)  We will let you know the results of your labs  I would recommend considering a recheck in about 1 month to make sure you're cured  Keep drinking plenty of fluids  Abstain from sex for 1 week    IF you received an x-ray today, you will receive an invoice from [facility]. Please contact [facility] at [phone] with questions or concerns regarding your invoice.   IF you received labwork today, you will receive an invoice from [facility]. Please contact [facility] at [phone] with questions or concerns regarding your invoice.   Our billing staff will not be able to assist you with questions regarding bills from these companies.  You will be contacted with the lab results as soon as they are available. The fastest way to get your results is to activate your My Chart account. Instructions are located on the last page of this paperwork.  If you have not heard from [place] regarding the results in 2 weeks, please contact this office.          Return in about 1 week (around 01/17/2016), or if symptoms worsen or fail to improve.   Trong Gosling, MD 01/10/2016

## 2016-01-10 NOTE — Patient Instructions (Addendum)
Always use condoms if you choose to be sexually involved  Take azithromycin 1000 mg single dose (250 mg 4)  We will let you know the results of your labs  I would recommend considering a recheck in about 1 month to make sure you're cured  Keep drinking plenty of fluids  Abstain from sex for 1 week    IF you received an x-ray today, you will receive an invoice from [facility]. Please contact [facility] at [phone] with questions or concerns regarding your invoice.   IF you received labwork today, you will receive an invoice from [facility]. Please contact [facility] at [phone] with questions or concerns regarding your invoice.   Our billing staff will not be able to assist you with questions regarding bills from these companies.  You will be contacted with the lab results as soon as they are available. The fastest way to get your results is to activate your My Chart account. Instructions are located on the last page of this paperwork. If you have not heard from us regarding the results in 2 weeks, please contact this office.

## 2016-01-12 LAB — GC/CHLAMYDIA PROBE AMP
CT PROBE, AMP APTIMA: DETECTED — AB
GC PROBE AMP APTIMA: NOT DETECTED

## 2016-01-13 LAB — SYPHILIS: RPR W/REFLEX TO RPR TITER AND TREPONEMAL ANTIBODIES, TRADITIONAL SCREENING AND DIAGNOSIS ALGORITHM

## 2017-08-31 ENCOUNTER — Encounter: Payer: Self-pay | Admitting: Physician Assistant

## 2017-08-31 ENCOUNTER — Ambulatory Visit: Payer: Self-pay | Admitting: Physician Assistant

## 2017-08-31 VITALS — BP 122/78 | HR 67 | Temp 97.8°F | Resp 18 | Ht 71.0 in | Wt 169.0 lb

## 2017-08-31 DIAGNOSIS — K645 Perianal venous thrombosis: Secondary | ICD-10-CM

## 2017-08-31 NOTE — Patient Instructions (Addendum)
Please take a warm sitz bath 4 times per day for 15 minutes Make sure you are increasing the fiber in your diet, a you can als    Hemorrhoids Hemorrhoids are swollen veins in and around the rectum or anus. Hemorrhoids can cause pain, itching, or bleeding. Most of the time, they do not cause serious problems. They usually get better with diet changes, lifestyle changes, and other home treatments. Follow these instructions at home: Eating and drinking  Eat foods that have fiber, such as whole grains, beans, nuts, fruits, and vegetables. Ask your doctor about taking products that have added fiber (fibersupplements).  Drink enough fluid to keep your pee (urine) clear or pale yellow. For Pain and Swelling  Take a warm-water bath (sitz bath) for 20 minutes to ease pain. Do this 3-4 times a day.  If directed, put ice on the painful area. It may be helpful to use ice between your warm baths. ? Put ice in a plastic bag. ? Place a towel between your skin and the bag. ? Leave the ice on for 20 minutes, 2-3 times a day. General instructions  Take over-the-counter and prescription medicines only as told by your doctor. ? Medicated creams and medicines that are inserted into the anus (suppositories) may be used or applied as told.  Exercise often.  Go to the bathroom when you have the urge to poop (to have a bowel movement). Do not wait.  Avoid pushing too hard (straining) when you poop.  Keep the butt area dry and clean. Use wet toilet paper or moist paper towels.  Do not sit on the toilet for a long time. Contact a doctor if:  You have any of these: ? Pain and swelling that do not get better with treatment or medicine. ? Bleeding that will not stop. ? Trouble pooping or you cannot poop. ? Pain or swelling outside the area of the hemorrhoids. This information is not intended to replace advice given to you by your health care provider. Make sure you discuss any questions you have with  your health care provider. Document Released: 07/20/2008 Document Revised: 03/18/2016 Document Reviewed: 06/25/2015 Elsevier Interactive Patient Education  2018 [employer]     IF you received an x-ray today, you will receive an invoice from [facility]. Please contact [facility] at [phone] with questions or concerns regarding your invoice.   IF you received labwork today, you will receive an invoice from [facility]. Please contact [facility] at [phone] with questions or concerns regarding your invoice.   Our billing staff will not be able to assist you with questions regarding bills from these companies.  You will be contacted with the lab results as soon as they are available. The fastest way to get your results is to activate your My Chart account. Instructions are located on the last page of this paperwork. If you have not heard from us regarding the results in 2 weeks, please contact this office.

## 2017-08-31 NOTE — Progress Notes (Signed)
[facility] [address], [place] [phone]  Date:  08/31/2017   Name:  Dustin Fisher   DOB:  1990-10-31   MRN:  [id number]  PCP:  Patient, No Pcp Per    History of Present Illness:  Dustin Fisher is a 26 y.o. male patient who presents to PCP with  Chief Complaint  Patient presents with  . Hemmroid    x1 week, pt states its mostly painful when he is at work and walking around. Pt states there hasn't been any bleeding since last night.    Patient had diarrhea 1 week ago, and had some straining, though this was diarrhea.  He has noticed some pain at his buttock.  No pain with the bowel movement.   No blood in stool.  Preparation H.  He did notice some blood on toilet paper.       There are no active problems to display for this patient.   History reviewed. No pertinent past medical history.  Past Surgical History:  Procedure Laterality Date  . PERCUTANEOUS PINNING ELBOW FRACTURE  2008    Social History   Tobacco Use  . Smoking status: Former Smoker    Packs/day: 0.20    Types: Cigarettes    Last attempt to quit: 05/20/2015    Years since quitting: 2.2  . Smokeless tobacco: Never Used  Substance Use Topics  . Alcohol use: Yes    Alcohol/week: 1.2 oz    Types: 2 Cans of beer per week  . Drug use: No    Family History  Problem Relation Age of Onset  . Hypertension Mother   . Diabetes Mother   . Diabetes Maternal Grandmother   . Heart disease Paternal Grandfather     Allergies  Allergen Reactions  . Morphine And Related Hives    Medication list has been reviewed and updated.  Current Outpatient Medications on File Prior to Visit  Medication Sig Dispense Refill  . ibuprofen (ADVIL,MOTRIN) 600 MG tablet Take 1 tablet (600 mg total) by mouth every 8 (eight) hours as needed. 30 tablet 0  . azithromycin (ZITHROMAX) 250 MG tablet Take 4 pills single dose now for probable chlamydia (Patient not taking: Reported on 08/31/2017) 4 tablet 0    Current Facility-Administered Medications on File Prior to Visit  Medication Dose Route Frequency Provider Last Rate Last Dose  . ibuprofen (ADVIL,MOTRIN) tablet 600 mg  600 mg Oral Once Copland, Jessica C, MD        ROS ROS otherwise unremarkable unless listed above.  Physical Examination: BP 122/78 (BP Location: Right Arm, Patient Position: Sitting, Cuff Size: Normal)   Pulse 67   Temp 97.8 F (36.6 C) (Oral)   Resp 18   Ht 5\' 11"  (1.803 m)   Wt 169 lb (76.7 kg)   SpO2 100%   BMI 23.57 kg/m  Ideal Body Weight: Weight in (lb) to have BMI = 25: 178.9  Physical Exam  Constitutional: He is oriented to person, place, and time. He appears well-developed and well-nourished. No distress.  HENT:  Head: Normocephalic and atraumatic.  Right Ear: Tympanic membrane, external ear and ear canal normal.  Left Ear: Tympanic membrane, external ear and ear canal normal.  Eyes: Conjunctivae and EOM are normal. Pupils are equal, round, and reactive to light.  Cardiovascular: Normal rate, regular rhythm, normal heart sounds and intact distal pulses. Exam reveals no friction rub.  No murmur heard. Pulmonary/Chest: Effort normal and breath sounds normal. No respiratory distress.  He has no wheezes.  Abdominal: Soft. Bowel sounds are normal. He exhibits no distension and no mass. There is no tenderness.  Genitourinary:  Genitourinary Comments: Thrombosed hemorrhoid detectable at the right 3 oclock region.  Mildly tender with slight sanguinous material.  Upon palpation, blood clot extracted.    Musculoskeletal: Normal range of motion. He exhibits no edema or tenderness.  Neurological: He is alert and oriented to person, place, and time. He displays normal reflexes.  Skin: Skin is warm and dry. He is not diaphoretic.  Psychiatric: He has a normal mood and affect. His behavior is normal.   PROCEDURE: verbal consent obtained.  1% lidocaine with epi to the thrombosed hemorrhoid.  Anesthesia obtained.   povidone swabbed x3.  11 blade utilized to make 1cm incision.  Generous sanguinous clotted material removed. A small incision (.3cm) placed at a thrombosed area that could not be accessed with forceps.  More thrombosed material removed.  Cleansed with normal saline.   Assessment and Plan: Dustin Fisher is a 26 y.o. male who is here today for cc of  Chief Complaint  Patient presents with  . hemorrhoids    x1 week, pt states its mostly painful when he is at work and walking around. Pt states there hasn't been any bleeding since last night.   External hemorrhoid, thrombosed  Trena PlattStephanie Shanayah Kaffenberger, PA-C [facility] 11/7/20189:23 AM

## 2018-01-13 ENCOUNTER — Other Ambulatory Visit: Payer: Self-pay

## 2018-01-13 ENCOUNTER — Ambulatory Visit (INDEPENDENT_AMBULATORY_CARE_PROVIDER_SITE_OTHER): Payer: Self-pay | Admitting: Physician Assistant

## 2018-01-13 ENCOUNTER — Encounter: Payer: Self-pay | Admitting: Physician Assistant

## 2018-01-13 VITALS — BP 132/88 | HR 76 | Temp 98.7°F | Resp 16 | Ht 69.75 in | Wt 171.6 lb

## 2018-01-13 DIAGNOSIS — J019 Acute sinusitis, unspecified: Secondary | ICD-10-CM

## 2018-01-13 DIAGNOSIS — B9789 Other viral agents as the cause of diseases classified elsewhere: Secondary | ICD-10-CM

## 2018-01-13 DIAGNOSIS — J3489 Other specified disorders of nose and nasal sinuses: Secondary | ICD-10-CM

## 2018-01-13 MED ORDER — FLUTICASONE PROPIONATE 50 MCG/ACT NA SUSP: 2.0000 | Freq: Every day | NASAL | 1 refills | Status: AC

## 2018-01-13 MED ORDER — AMOXICILLIN-POT CLAVULANATE 875-125 MG PO TABS: 1.0000 | ORAL_TABLET | Freq: Two times a day (BID) | ORAL | 0 refills | Status: AC

## 2018-01-13 NOTE — Progress Notes (Signed)
   Dustin Fisher  MRN: [id number] DOB: [birth year]  PCP: Patient, No Pcp Per  Subjective:  Pt is a 27 year old male who presents to clinic for sinus congestion x 1.5 weeks. C/o pressure in his face with nasal drainage. He has been taking Mucinex and antihistamines.  No improvement.   Review of Systems  Constitutional: Negative for chills, diaphoresis, fatigue and fever.  HENT: Negative for congestion, postnasal drip, rhinorrhea, sinus pressure, sinus pain, sneezing and sore throat.   Respiratory: Negative for cough, shortness of breath and wheezing.     There are no active problems to display for this patient.   Current Outpatient Medications on File Prior to Visit  Medication Sig Dispense Refill  . Fexofenadine HCl (MUCINEX ALLERGY PO) Take by mouth.     Current Facility-Administered Medications on File Prior to Visit  Medication Dose Route Frequency Provider Last Rate Last Dose  . ibuprofen (ADVIL,MOTRIN) tablet 600 mg  600 mg Oral Once Copland, Gwenlyn FoundJessica C, MD        Allergies  Allergen Reactions  . Morphine And Related Hives     Objective:  BP 132/88   Pulse 76   Temp 98.7 F (37.1 C) (Oral)   Resp 16   Ht 5' 9.75" (1.772 m)   Wt 171 lb 9.6 oz (77.8 kg)   SpO2 97%   BMI 24.80 kg/m   Physical Exam  Constitutional: He is oriented to person, place, and time and well-developed, well-nourished, and in no distress. No distress.  HENT:  Right Ear: Tympanic membrane normal.  Left Ear: Tympanic membrane normal.  Nose: Mucosal edema present. No rhinorrhea. Right sinus exhibits no maxillary sinus tenderness and no frontal sinus tenderness. Left sinus exhibits no maxillary sinus tenderness and no frontal sinus tenderness.  Mouth/Throat: Oropharynx is clear and moist and mucous membranes are normal.  Cardiovascular: Normal rate, regular rhythm and normal heart sounds.  Pulmonary/Chest: Effort normal and breath sounds normal. No respiratory distress. He has no wheezes. He has no  rales.  Neurological: He is alert and oriented to person, place, and time. GCS score is 15.  Skin: Skin is warm and dry.  Psychiatric: Mood, memory, affect and judgment normal.  Vitals reviewed.   Assessment and Plan :  1. Acute viral sinusitis - amoxicillin-clavulanate (AUGMENTIN) 875-125 MG tablet; Take 1 tablet by mouth 2 (two) times daily.  Dispense: 14 tablet; Refill: 0 - Suspect viral sinusitis. Plan to treat for the next 5 days supportively. Rx printed for Augmentin ok to fill in 5 days if no improvement. He understands and agrees.  2. Sinus pressure - fluticasone (FLONASE) 50 MCG/ACT nasal spray; Place 2 sprays into both nostrils daily.  Dispense: 16 g; Refill: 1   Whitney Marcelyn Ruppe, PA-C  [facility] 01/13/2018 12:07 PM

## 2018-01-13 NOTE — Patient Instructions (Addendum)
I am treating you today for viral sinusitis (see below).  Consider using a Neti Pot (you can buy this at your pharmacy). If not, try the saline sinus rinse.   Continue taking Mucinex D for the next 3-4 days. Drink plenty of water while taking this medication. Water will help thin out your mucus.  Flonase nasal spray - use this twice daily.  For your eyes: OpCon-A or Naphcon-A If you are not improving in 5-7 days you can fill the antibiotic Rx at your pharmacy.  Come back if you are not better in 7-10 days.   ACUTE VIRAL RHINOSINUSITIS-Patients with acute viral rhinosinusitis (AVRS) should be managed with supportive care. There are no treatments to shorten the clinical course of the disease.  \AVRS may not completely resolve within 10 days but is expected to improve. Patients who fail to improve after ?10 days of symptomatic management are more likely to have acute bacterial rhinosinusitis. Symptomatic therapies-Symptomatic management of acute rhinosinusitis (ARS) aims to relieve symptoms of nasal obstruction and runny nose as well as the systemic signs and symptoms such as fever and fatigue. When needed, we suggest over-the-counter (OTC) analgesics and antipyretics, saline irrigation, and intranasal glucocorticoids for symptomatic management in patients with ARS. Analgesics and antipyretics-OTC analgesics and antipyretics such as nonsteroidal anti-inflammatory drugs and acetaminophen can be used for pain and fever relief as needed. Saline irrigation-Mechanical irrigation with saline may reduce the need for pain medication and improve overall patient comfort, particularly in patients with frequent sinus infections. It is important that irrigants be prepared from sterile or bottled water. (See below for instructions)  Intranasal glucocorticoids- Intranasal glucocorticoids are likely to be most beneficial for patients with underlying allergies. This allows improved sinus drainage. A higher dose  of intranasal glucocorticoids had a stronger effect on symptom improvement. -Other- ?Oral decongestants - Oral decongestants may be useful when eustachian tube dysfunction is a factor for patients with AVRS. These patients may benefit from a short course (three to five days) of oral decongestants. Oral decongestants should be used with caution in patients with cardiovascular disease, hypertension, angle-closure glaucoma, or bladder neck obstruction. ?Intranasal decongestants - Intranasal decongestants are often used as symptomatic therapies by patients. These agents, such as oxymetazoline, may provide a subjective sense of improved nasal patency. There is also concern that intranasal decongestants themselves may provoke mucosal inflammation. If used, topical decongestants should be used sparingly for no more than three consecutive days to avoid rebound congestion, addiction, and mucosal damage associated with long-term use. ?Antihistamines - Antihistamines are frequently used for symptom relief due to their drying effects; however, there are no studies investigating their efficacy for ARS. Over-drying of the mucosa may lead to further discomfort. Additionally, antihistamines are often associated with adverse effects.  ?Mucolytics - Mucolytics such as guaifenesin serve to thin secretions and may promote ease of mucus drainage and clearance.   SALINE NASAL IRRIGATION  The benefits  1. Saline (saltwater) washes the mucus and irritants from your nose.  2. The sinus passages are moisturized.  3. Studies have also shown that a nasal irrigation improves cell function (the cells that move the mucus work better).  The recipe  Use a one-quart glass jar that is thoroughly cleansed.  You may use a large medical syringe (30 cc), water pick with an irrigation tip (preferred method), squeeze bottle, or Neti pot. Do not use a baby bulb syringe. The syringe or pick should be sterilized frequently or replaced every two  to three weeks to avoid  contamination and infection.  Fill with water that has been distilled, previously boiled, or otherwise sterilized. Plain tap water is not recommended, because it is not necessarily sterile.  Add 1 to 1 heaping teaspoons of pickling/canning salt. Do not use table salt, because it contains a large number of additives.  Add 1 teaspoon of baking soda (pure bicarbonate).  Mix ingredients together, and store at room temperature. Discard after one week.  You may also make up a solution from premixed packets that are commercially prepared specifically for nasal irrigation.  The instructions  Irrigate your nose with saline one to two times per day.   If you have been told to use nasal medication, you should always use your saline solution first. The nasal medication is much more effective when sprayed onto clean nasal membranes, and the spray will reach deeper into the nose.   Pour the amount of fluid you plan to use into a clean bowl. Do not put your used syringe back into the storage container, because it contaminates your solution.   You may warm the solution slightly in the microwave, but be sure that the solution is not hot.   Bend over the sink (some people do this in the shower), and squirt the solution into each side of your nose, aiming the stream toward the back of your head, not the top of your head. The solution should flow into one nostril and out of the other, but it will not harm you if you swallow a little.   Some people experience a little burning sensation the first few times that they use buffered saline solution, but this usually goes away after they adapt to it.     Thank you for coming in today. I hope you feel we met your needs.  Feel free to call PCP if you have any questions or further requests.  Please consider signing up for MyChart if you do not already have it, as this is a great way to communicate with me.  Best,  Whitney McVey, PA-C   IF you  received an x-ray today, you will receive an invoice from [facility]. Please contact [facility] at [phone] with questions or concerns regarding your invoice.   IF you received labwork today, you will receive an invoice from [place]. Please contact [facility] at [phone] with questions or concerns regarding your invoice.   Our billing staff will not be able to assist you with questions regarding bills from these companies.  You will be contacted with the lab results as soon as they are available. The fastest way to get your results is to activate your My Chart account. Instructions are located on the last page of this paperwork. If you have not heard from [place] regarding the results in 2 weeks, please contact this office.

## 2018-01-14 ENCOUNTER — Telehealth: Payer: Self-pay | Admitting: Physician Assistant

## 2018-01-14 NOTE — Telephone Encounter (Signed)
Pt. Is requesting a work note excusing them from work on 01/13/18-01/14/18 for the issue they were seen for on 01/13/18

## 2018-01-16 ENCOUNTER — Encounter: Payer: Self-pay | Admitting: *Deleted

## 2018-01-16 NOTE — Telephone Encounter (Signed)
Message left note ready for pick up at [address].

## 2018-01-25 ENCOUNTER — Encounter: Payer: Self-pay | Admitting: Physician Assistant

## 2019-10-12 ENCOUNTER — Other Ambulatory Visit: Payer: Self-pay

## 2020-01-03 ENCOUNTER — Ambulatory Visit: Payer: Self-pay | Attending: Internal Medicine

## 2020-01-10 ENCOUNTER — Ambulatory Visit: Payer: Self-pay | Attending: Internal Medicine

## 2020-01-10 DIAGNOSIS — Z23 Encounter for immunization: Secondary | ICD-10-CM

## 2020-01-10 NOTE — Progress Notes (Signed)
   Covid-19 Vaccination Clinic  Name:  Dustin Fisher    MRN: [id number] DOB: [birth year]  01/10/2020  Dustin Fisher was observed post Covid-19 immunization for 15 minutes without incident. He was provided with Vaccine Information Sheet and instruction to access the V-Safe system.   Dustin Fisher was instructed to call 911 with any severe reactions post vaccine: Marland Kitchen Difficulty breathing  . Swelling of face and throat  . A fast heartbeat  . A bad rash all over body  . Dizziness and weakness   Immunizations Administered    Name Date Dose VIS Date Route   Pfizer COVID-19 Vaccine 01/10/2020  8:19 AM 0.3 mL 10/05/2019 Intramuscular   Manufacturer: [employer], [employer]   Lot: [id number]   NDC: [address]-1

## 2020-02-04 ENCOUNTER — Ambulatory Visit: Payer: Self-pay | Attending: Internal Medicine

## 2020-02-04 DIAGNOSIS — Z23 Encounter for immunization: Secondary | ICD-10-CM

## 2020-02-04 NOTE — Progress Notes (Signed)
   Covid-19 Vaccination Clinic  Name:  Dustin Fisher    MRN: [id number] DOB: July 27, 1991  02/04/2020  Mr. Dustin Fisher was observed post Covid-19 immunization for 15 minutes without incident. He was provided with Vaccine Information Sheet and instruction to access the V-Safe system.   Mr. Dustin Fisher was instructed to call 911 with any severe reactions post vaccine: Marland Kitchen Difficulty breathing  . Swelling of face and throat  . A fast heartbeat  . A bad rash all over body  . Dizziness and weakness   Immunizations Administered    Name Date Dose VIS Date Route   Pfizer COVID-19 Vaccine 02/04/2020  8:25 AM 0.3 mL 10/05/2019 Intramuscular   Manufacturer: [employer], [employer]   Lot: [id number]   NDC: [address]-2
